# Patient Record
Sex: Female | Born: 2000 | Race: Black or African American | Hispanic: No | Marital: Single | Smoking: Never smoker
Health system: Southern US, Community
[De-identification: ages and names within clinical notes are randomized; demographics above are authoritative.]

## PROBLEM LIST (undated history)

## (undated) DIAGNOSIS — E559 Vitamin D deficiency, unspecified: Secondary | ICD-10-CM

## (undated) HISTORY — DX: Vitamin D deficiency, unspecified: E55.9

---
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
## 2021-01-07 DIAGNOSIS — Z419 Encounter for procedure for purposes other than remedying health state, unspecified: Secondary | ICD-10-CM | POA: Diagnosis not present

## 2022-07-12 IMAGING — DX DG CHEST 1V PORT
1 series · 1 of 1 positions shown · non-contrast
Comparison: None.

CLINICAL DATA: Chest pain

EXAM:
PORTABLE CHEST 1 VIEW

[chest]
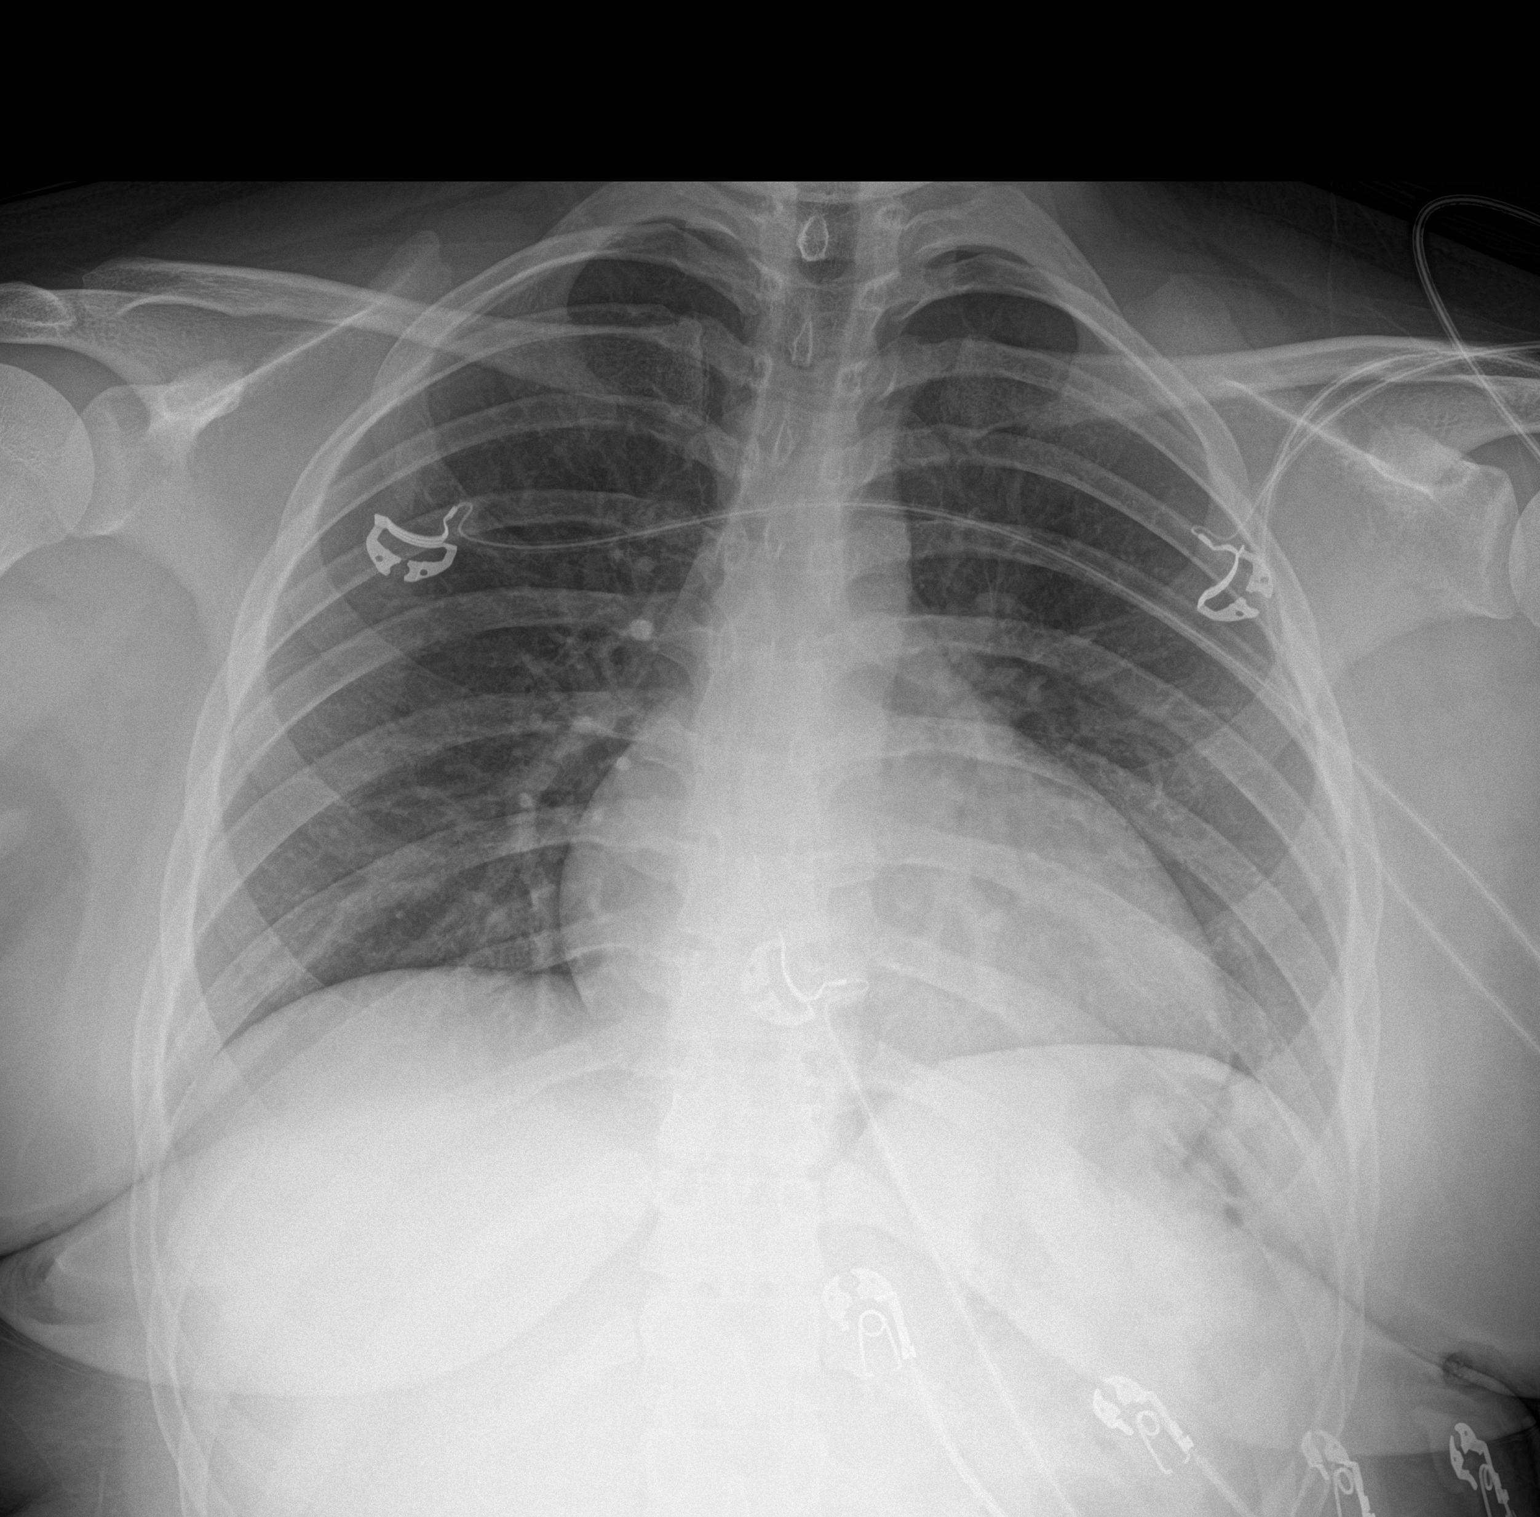

[1 of 1 positions shown; findings below may reference images not displayed]

FINDINGS: The heart size and mediastinal contours are within normal limits.
Both lungs are clear. The visualized skeletal structures are
unremarkable.
IMPRESSION: No active disease.

## 2022-11-10 ENCOUNTER — Inpatient Hospital Stay (HOSPITAL_COMMUNITY)
Admission: AD | Admit: 2022-11-10 | Discharge: 2022-11-10 | Disposition: A | Discharge status: Home / Self Care | Payer: Medicaid Other | Attending: Obstetrics and Gynecology | Admitting: Obstetrics and Gynecology

## 2022-11-10 ENCOUNTER — Encounter (HOSPITAL_COMMUNITY): Payer: Self-pay | Admitting: Obstetrics and Gynecology

## 2022-11-10 ENCOUNTER — Inpatient Hospital Stay (HOSPITAL_COMMUNITY): Payer: Medicaid Other

## 2022-11-10 ENCOUNTER — Ambulatory Visit
Admission: EM | Admit: 2022-11-10 | Discharge: 2022-11-10 | Disposition: A | Discharge status: Home / Self Care | Attending: Internal Medicine | Admitting: Internal Medicine

## 2022-11-10 DIAGNOSIS — Z3A11 11 weeks gestation of pregnancy: Secondary | ICD-10-CM

## 2022-11-10 DIAGNOSIS — O034 Incomplete spontaneous abortion without complication: Secondary | ICD-10-CM | POA: Insufficient documentation

## 2022-11-10 DIAGNOSIS — O26859 Spotting complicating pregnancy, unspecified trimester: Secondary | ICD-10-CM

## 2022-11-10 DIAGNOSIS — O039 Complete or unspecified spontaneous abortion without complication: Secondary | ICD-10-CM

## 2022-11-10 LAB — CBC
HCT: 38.6 % (ref 36.0–46.0)
Hemoglobin: 12.5 g/dL (ref 12.0–15.0)
MCH: 25.3 pg — ABNORMAL LOW (ref 26.0–34.0)
MCHC: 32.4 g/dL (ref 30.0–36.0)
MCV: 78 fL — ABNORMAL LOW (ref 80.0–100.0)
Platelets: 284 10*3/uL (ref 150–400)
RBC: 4.95 MIL/uL (ref 3.87–5.11)
RDW: 13.4 % (ref 11.5–15.5)
WBC: 9.1 10*3/uL (ref 4.0–10.5)
nRBC: 0 % (ref 0.0–0.2)

## 2022-11-10 LAB — URINALYSIS, ROUTINE W REFLEX MICROSCOPIC
Bilirubin Urine: NEGATIVE
Glucose, UA: NEGATIVE mg/dL
Ketones, ur: NEGATIVE mg/dL
Nitrite: NEGATIVE
Protein, ur: NEGATIVE mg/dL
Specific Gravity, Urine: 1.017 (ref 1.005–1.030)
pH: 6 (ref 5.0–8.0)

## 2022-11-10 LAB — HIV ANTIBODY (ROUTINE TESTING W REFLEX): HIV Screen 4th Generation wRfx: NONREACTIVE

## 2022-11-10 LAB — HCG, QUANTITATIVE, PREGNANCY: hCG, Beta Chain, Quant, S: 15404 m[IU]/mL — ABNORMAL HIGH (ref <5)

## 2022-11-10 LAB — POCT URINE PREGNANCY: Preg Test, Ur: POSITIVE — AB

## 2022-11-10 MED ORDER — KETOROLAC TROMETHAMINE 60 MG/2ML IM SOLN
60.0000 mg | Freq: Once | INTRAMUSCULAR | Status: AC
  Administered 2022-11-10: 60 mg via INTRAMUSCULAR
  Filled 2022-11-10: qty 2

## 2022-11-10 MED ORDER — IBUPROFEN 800 MG PO TABS: 800.0000 mg | ORAL_TABLET | Freq: Three times a day (TID) | ORAL | 0 refills | Status: AC | PRN

## 2022-11-10 MED ORDER — MISOPROSTOL 200 MCG PO TABS: 600.0000 ug | ORAL_TABLET | Freq: Once | ORAL | Status: AC

## 2022-11-10 MED ORDER — MISOPROSTOL 200 MCG PO TABS
ORAL_TABLET | ORAL | Status: AC
  Administered 2022-11-10: 600 ug via BUCCAL
  Filled 2022-11-10: qty 3

## 2022-11-10 NOTE — Discharge Instructions (Addendum)
Go to the emergency room for further evaluation of your symptoms

## 2022-11-10 NOTE — ED Notes (Signed)
Patient is being discharged from the Urgent Care and sent to the Emergency Department via POV . Per Cheri Rous NP, patient is in need of higher level of care due to positive pregnancy, abd cramping and bleeding. Patient is aware and verbalizes understanding of plan of care.  Vitals:   11/10/22 0903  BP: 119/83  Pulse: 76  Resp: 17  Temp: 98.1 F (36.7 C)  SpO2: 98%

## 2022-11-10 NOTE — MAU Note (Addendum)
.  Betty Martin is a 22 y.o. at Unknown here in MAU reporting: Intermittent pelvic pain that began last night as well as two episodes of vaginal spotting last night and again at 0400. Reports brown discharge. Denies vaginal itching and vaginal odors. Denies urinary sx's. Last IC yesterday morning.  Has not been seen with an OB yet.  LMP: 08/23/2022 Onset of complaint: Last night Pain score: Denies current pain. Reports when it occurs it is an 8/10.  FHT: unable to doppler FHT Lab orders placed from triage: UA

## 2022-11-10 NOTE — MAU Provider Note (Signed)
History     CSN: [id number]  Arrival date and time: 11/10/22 7829   Event Date/Time   First Provider Initiated Contact with Patient 11/10/22 1157      Chief Complaint  Patient presents with   Pelvic Pain   HPI  [name] is a 22 y.o. female G1P0 @ [redacted]w[redacted]d  (certain LMP) here with vaginal bleeding and abdominal pain. The Abdominal pain and vaginal bleeding started yesterday. She reports more intense pain today. The pain comes and goes. The pain is stronger than a menstrual cramp. Currently rates her pain 8/10  While in MAU the patient called out and reports Passing her fetus.   OB History     Gravida  1   Para      Term      Preterm      AB      Living         SAB      IAB      Ectopic      Multiple      Live Births              No past medical history on file.  No past surgical history on file.  No family history on file.  Social History   Tobacco Use   Smoking status: Never   Smokeless tobacco: Never  Substance Use Topics   Alcohol use: Never   Drug use: Never    Allergies: No Known Allergies  Medications Prior to Admission  Medication Sig Dispense Refill Last Dose   ferrous sulfate 324 MG TBEC Take 324 mg by mouth.   11/09/2022   Prenatal Vit-Fe Fumarate-FA (PRENATAL MULTIVITAMIN) TABS tablet Take 1 tablet by mouth daily at 12 noon.   11/09/2022   Results for orders placed or performed during the hospital encounter of 11/10/22 (from the past 48 hour(s))  Urinalysis, Routine w reflex microscopic -Urine, Clean Catch     Status: Abnormal   Collection Time: 11/10/22  9:47 AM  Result Value Ref Range   Color, Urine YELLOW YELLOW   APPearance HAZY (A) CLEAR   Specific Gravity, Urine 1.017 1.005 - 1.030   pH 6.0 5.0 - 8.0   Glucose, UA NEGATIVE NEGATIVE mg/dL   Hgb urine dipstick MODERATE (A) NEGATIVE   Bilirubin Urine NEGATIVE NEGATIVE   Ketones, ur NEGATIVE NEGATIVE mg/dL   Protein, ur NEGATIVE NEGATIVE mg/dL   Nitrite NEGATIVE  NEGATIVE   Leukocytes,Ua MODERATE (A) NEGATIVE   RBC / HPF 21-50 0 - 5 RBC/hpf   WBC, UA 11-20 0 - 5 WBC/hpf   Bacteria, UA RARE (A) NONE SEEN   Squamous Epithelial / HPF 0-5 0 - 5 /HPF   Mucus PRESENT     Comment: Performed at [facility], [address] [place], [place] [address]  CBC     Status: Abnormal   Collection Time: 11/10/22 11:08 AM  Result Value Ref Range   WBC 9.1 4.0 - 10.5 K/uL   RBC 4.95 3.87 - 5.11 MIL/uL   Hemoglobin 12.5 12.0 - 15.0 g/dL   HCT 08.6 57.8 - 46.9 %   MCV 78.0 (L) 80.0 - 100.0 fL   MCH 25.3 (L) 26.0 - 34.0 pg   MCHC 32.4 30.0 - 36.0 g/dL   RDW 62.9 52.8 - 41.3 %   Platelets 284 150 - 400 K/uL   nRBC 0.0 0.0 - 0.2 %    Comment: Performed at [facility], [address],  [place], [place] [address]  ABO/Rh     Status: None   Collection Time: 11/10/22 11:08 AM  Result Value Ref Range   ABO/RH(D)      O POS Performed at [facility], [address] [place], [place] [address]   hCG, quantitative, pregnancy     Status: Abnormal   Collection Time: 11/10/22 11:08 AM  Result Value Ref Range   hCG, Beta Chain, Quant, S 15,404 (H) <5 mIU/mL    Comment:          GEST. AGE      CONC.  (mIU/mL)   <=1 WEEK        5 - 50     2 WEEKS       50 - 500     3 WEEKS       100 - 10,000     4 WEEKS     1,000 - 30,000     5 WEEKS     3,500 - 115,000   6-8 WEEKS     12,000 - 270,000    12 WEEKS     15,000 - 220,000        FEMALE AND NON-PREGNANT FEMALE:     LESS THAN 5 mIU/mL Performed at [facility], [address], [place], [place] [address]   HIV Antibody (routine testing w rflx)     Status: None   Collection Time: 11/10/22 11:08 AM  Result Value Ref Range   HIV Screen 4th Generation wRfx Non Reactive Non Reactive    Comment: Performed at [facility], [address], [place], [place] [address]    US OB LESS THAN 14 WEEKS WITH OB TRANSVAGINAL  Result Date: 11/10/2022 CLINICAL DATA:  Spotting and cramping affecting pregnancy,  antepartum. Quantitative beta hCG pending. Last menstrual period 08/23/2022 corresponding to estimated gestational age of [redacted] weeks 2 days. EXAM: OBSTETRIC <14 WK [place] AND TRANSVAGINAL OB US TECHNIQUE: Both transabdominal and transvaginal ultrasound examinations were performed for complete evaluation of the gestation as well as the maternal uterus, adnexal regions, and pelvic cul-de-sac. Transvaginal technique was performed to assess early pregnancy. The sonographer reports the patient did not tolerate the transvaginal exam well. COMPARISON:  None Available. FINDINGS: Intrauterine gestational sac: None Yolk sac:  Not Visualized. Embryo:  Not Visualized. Cardiac Activity: Not applicable Subchorionic hemorrhage:  None visualized. Maternal uterus/adnexae: The endometrium is thickened and heterogeneous, measuring up to approximately 26 mm in AP dimension. No gestational sac is visualized. IMPRESSION: 1. No gestational sac or fetal pole is seen. No intrauterine pregnancy is identified. 2. Differential considerations include early pregnancy, failed pregnancy, or ectopic pregnancy. Recommend correlation with the pending quantitative beta HCG. 3. Recommend serial follow-up clinical exams and quantitative beta HCG evaluation. Based on this information, follow-up ultrasound also can be performed. Electronically Signed   By: Neita Garnet M.D.   On: 11/10/2022 14:51     Review of Systems  Constitutional:  Negative for fever.  Gastrointestinal:  Positive for abdominal pain and nausea. Negative for vomiting.  Genitourinary:  Positive for vaginal bleeding.   Physical Exam   Blood pressure 117/81, pulse 82, temperature 97.9 F (36.6 C), temperature source Oral, resp. rate 16, height 5\' 1"  (1.549 m), weight 87.8 kg, last menstrual period 08/23/2022, SpO2 97%.  Physical Exam Constitutional:      General: She is not in acute distress.    Appearance: Normal appearance. She is not ill-appearing, toxic-appearing or  diaphoretic.  Genitourinary:  Comments: Vagina - large amount of bleeding with multiple small- moderate sized clots  Cervix - 1-2 CM dilated with placenta tissue coming out of cervix.  Majority of placenta passed. Small placenta tissue within cervical os, unable to remove with ring forceps.  Bleeding subsided following placenta passing.  Bimanual exam: deferred. Chaperone present for exam.   Skin:    General: Skin is warm.  Neurological:     Mental Status: She is alert and oriented to person, place, and time.  Psychiatric:        Behavior: Behavior normal.    MAU Course  Procedures  MDM  10/11 week fetus noted on tissue paper. Fetus with attached placental cord.   Toradol 60 mg IM CBC, HCG, ABO Hgb stable  [place] ordered and reassuring findings other than ?POC although expected given recent SAB Cytotec buccal given Oral 600 mcg Placental tissue sent to pathology Patient requested to take fetus home.  Patient rates her pain 0/10 at the time of DC home.  Bleeding minimal to absent at the time of DC.  O positive blood type.   Assessment and Plan   A:  1. SAB (spontaneous abortion)   2. [redacted] weeks gestation of pregnancy   3. Incomplete abortion      P:  Dc home Strict return precautions ie: Fever, abdominal pain, heavy bleeding Rx: ibuprofen Q6 hours. Message sent to [facility] for follow up in 1 week.  Support given  Venia Carbon I, NP 11/10/2022 9:03 PM

## 2022-11-10 NOTE — ED Provider Notes (Signed)
[facility]    CSN: [id number] Arrival date & time: 11/10/22  0825      History   Chief Complaint No chief complaint on file.   HPI Betty Martin is a 22 y.o. female presents for abdominal cramping/pain and spotting in pregnancy.  Patient reports she is 11 weeks and 2 days pregnant.  Last night she began developing abdominal cramping that she describes as severe with vaginal spotting.  Denies any urinary symptoms, dizziness, nausea or vomiting.  This is her first pregnancy.  She has not seen OB.  No other concerns at this time.  HPI  No past medical history on file.  There are no problems to display for this patient.   No past surgical history on file.  OB History   No obstetric history on file.      Home Medications    Prior to Admission medications   Not on File    Family History No family history on file.  Social History Social History   Tobacco Use   Smoking status: Never   Smokeless tobacco: Never  Substance Use Topics   Alcohol use: Never   Drug use: Never     Allergies   Patient has no known allergies.   Review of Systems Review of Systems  Gastrointestinal:  Positive for abdominal pain.  Genitourinary:  Positive for vaginal bleeding.     Physical Exam Triage Vital Signs ED Triage Vitals [11/10/22 0903]  Encounter Vitals Group     BP 119/83     Systolic BP Percentile      Diastolic BP Percentile      Pulse Rate 76     Resp 17     Temp 98.1 F (36.7 C)     Temp Source Oral     SpO2 98 %     Weight      Height      Head Circumference      Peak Flow      Pain Score      Pain Loc      Pain Education      Exclude from Growth Chart    No data found.  Updated Vital Signs BP 119/83 (BP Location: Left Arm)   Pulse 76   Temp 98.1 F (36.7 C) (Oral)   Resp 17   SpO2 98%   Visual Acuity Right Eye Distance:   Left Eye Distance:   Bilateral Distance:    Right Eye Near:   Left Eye Near:    Bilateral Near:      Physical Exam Vitals and nursing note reviewed.  Constitutional:      General: She is not in acute distress.    Appearance: Normal appearance. She is not ill-appearing.  HENT:     Head: Normocephalic and atraumatic.  Eyes:     Pupils: Pupils are equal, round, and reactive to light.  Cardiovascular:     Rate and Rhythm: Normal rate.  Pulmonary:     Effort: Pulmonary effort is normal.  Skin:    General: Skin is warm and dry.  Neurological:     General: No focal deficit present.     Mental Status: She is alert and oriented to person, place, and time.  Psychiatric:        Mood and Affect: Mood normal.        Behavior: Behavior normal.      UC Treatments / Results  Labs (all labs ordered are listed, but only abnormal results  are displayed) Labs Reviewed  POCT URINE PREGNANCY - Abnormal; Notable for the following components:      Result Value   Preg Test, Ur Positive (*)    All other components within normal limits    EKG   Radiology No results found.  Procedures Procedures (including critical care time)  Medications Ordered in UC Medications - No data to display  Initial Impression / Assessment and Plan / UC Course  I have reviewed the triage vital signs and the nursing notes.  Pertinent labs & imaging results that were available during my care of the patient were reviewed by me and considered in my medical decision making (see chart for details).     Patient with confirmed urine pregnancy in clinic reporting that she is 11 weeks and 2 days pregnant with severe abdominal cramping and vaginal spotting.  Discussed limitations and abilities of urgent care.  Advised patient go to [facility] emergency room/MAU for further evaluation of symptoms/rule out miscarriage.  Patient is in agreement with plan will go POV with her mother to the emergency room. Final Clinical Impressions(s) / UC Diagnoses   Final diagnoses:  [redacted] weeks gestation of pregnancy  Spotting in  pregnancy     Discharge Instructions      Go to the emergency room for further evaluation of your symptoms    ED Prescriptions   None    PDMP not reviewed this encounter.   Radford Pax, NP 11/10/22 [phone]

## 2022-11-11 LAB — ABO/RH: ABO/RH(D): O POS

## 2022-11-12 LAB — SURGICAL PATHOLOGY

## 2022-12-08 DIAGNOSIS — Z419 Encounter for procedure for purposes other than remedying health state, unspecified: Secondary | ICD-10-CM | POA: Diagnosis not present

## 2023-01-08 DIAGNOSIS — Z419 Encounter for procedure for purposes other than remedying health state, unspecified: Secondary | ICD-10-CM | POA: Diagnosis not present

## 2023-02-07 DIAGNOSIS — Z419 Encounter for procedure for purposes other than remedying health state, unspecified: Secondary | ICD-10-CM | POA: Diagnosis not present

## 2023-03-10 DIAGNOSIS — Z419 Encounter for procedure for purposes other than remedying health state, unspecified: Secondary | ICD-10-CM | POA: Diagnosis not present

## 2023-04-10 DIAGNOSIS — Z419 Encounter for procedure for purposes other than remedying health state, unspecified: Secondary | ICD-10-CM | POA: Diagnosis not present

## 2023-05-08 DIAGNOSIS — Z419 Encounter for procedure for purposes other than remedying health state, unspecified: Secondary | ICD-10-CM | POA: Diagnosis not present

## 2023-06-19 DIAGNOSIS — Z419 Encounter for procedure for purposes other than remedying health state, unspecified: Secondary | ICD-10-CM | POA: Diagnosis not present

## 2023-07-19 DIAGNOSIS — Z419 Encounter for procedure for purposes other than remedying health state, unspecified: Secondary | ICD-10-CM | POA: Diagnosis not present

## 2023-07-26 DIAGNOSIS — E88819 Insulin resistance, unspecified: Secondary | ICD-10-CM | POA: Diagnosis not present

## 2023-07-26 DIAGNOSIS — Z7251 High risk heterosexual behavior: Secondary | ICD-10-CM | POA: Diagnosis not present

## 2023-07-26 DIAGNOSIS — Z1331 Encounter for screening for depression: Secondary | ICD-10-CM | POA: Diagnosis not present

## 2023-07-26 DIAGNOSIS — E559 Vitamin D deficiency, unspecified: Secondary | ICD-10-CM | POA: Diagnosis not present

## 2023-07-26 DIAGNOSIS — Z1339 Encounter for screening examination for other mental health and behavioral disorders: Secondary | ICD-10-CM | POA: Diagnosis not present

## 2023-07-26 DIAGNOSIS — Z1159 Encounter for screening for other viral diseases: Secondary | ICD-10-CM | POA: Diagnosis not present

## 2023-07-26 DIAGNOSIS — R0602 Shortness of breath: Secondary | ICD-10-CM | POA: Diagnosis not present

## 2023-07-26 DIAGNOSIS — R5383 Other fatigue: Secondary | ICD-10-CM | POA: Diagnosis not present

## 2023-07-26 DIAGNOSIS — Z0000 Encounter for general adult medical examination without abnormal findings: Secondary | ICD-10-CM | POA: Diagnosis not present

## 2023-07-26 DIAGNOSIS — Z6838 Body mass index (BMI) 38.0-38.9, adult: Secondary | ICD-10-CM | POA: Diagnosis not present

## 2023-08-19 DIAGNOSIS — Z419 Encounter for procedure for purposes other than remedying health state, unspecified: Secondary | ICD-10-CM | POA: Diagnosis not present

## 2023-09-18 DIAGNOSIS — Z419 Encounter for procedure for purposes other than remedying health state, unspecified: Secondary | ICD-10-CM | POA: Diagnosis not present

## 2023-10-19 DIAGNOSIS — Z419 Encounter for procedure for purposes other than remedying health state, unspecified: Secondary | ICD-10-CM | POA: Diagnosis not present

## 2023-11-29 DIAGNOSIS — Z79899 Other long term (current) drug therapy: Secondary | ICD-10-CM | POA: Diagnosis not present

## 2023-11-29 DIAGNOSIS — Z7251 High risk heterosexual behavior: Secondary | ICD-10-CM | POA: Diagnosis not present

## 2023-11-29 DIAGNOSIS — E88819 Insulin resistance, unspecified: Secondary | ICD-10-CM | POA: Diagnosis not present

## 2023-11-29 DIAGNOSIS — R5383 Other fatigue: Secondary | ICD-10-CM | POA: Diagnosis not present

## 2023-11-29 DIAGNOSIS — Z6838 Body mass index (BMI) 38.0-38.9, adult: Secondary | ICD-10-CM | POA: Diagnosis not present

## 2023-11-29 DIAGNOSIS — E559 Vitamin D deficiency, unspecified: Secondary | ICD-10-CM | POA: Diagnosis not present

## 2023-11-29 DIAGNOSIS — Z5320 Procedure and treatment not carried out because of patient's decision for unspecified reasons: Secondary | ICD-10-CM | POA: Diagnosis not present

## 2023-11-29 DIAGNOSIS — E786 Lipoprotein deficiency: Secondary | ICD-10-CM | POA: Diagnosis not present

## 2024-03-06 LAB — PREGNANCY, URINE: Pregnancy Test, Urine: POSITIVE

## 2024-03-21 ENCOUNTER — Encounter: Payer: Self-pay | Admitting: Obstetrics and Gynecology

## 2024-03-21 DIAGNOSIS — O3680X0 Pregnancy with inconclusive fetal viability, not applicable or unspecified: Secondary | ICD-10-CM

## 2024-03-27 ENCOUNTER — Other Ambulatory Visit: Payer: Self-pay

## 2024-03-27 DIAGNOSIS — O3680X0 Pregnancy with inconclusive fetal viability, not applicable or unspecified: Secondary | ICD-10-CM

## 2024-03-28 ENCOUNTER — Other Ambulatory Visit: Payer: Self-pay

## 2024-03-28 ENCOUNTER — Other Ambulatory Visit

## 2024-03-28 ENCOUNTER — Other Ambulatory Visit: Payer: Self-pay | Admitting: Certified Nurse Midwife

## 2024-03-28 DIAGNOSIS — Z3491 Encounter for supervision of normal pregnancy, unspecified, first trimester: Secondary | ICD-10-CM | POA: Diagnosis not present

## 2024-03-28 DIAGNOSIS — O3680X0 Pregnancy with inconclusive fetal viability, not applicable or unspecified: Secondary | ICD-10-CM

## 2024-03-28 DIAGNOSIS — Z3A01 Less than 8 weeks gestation of pregnancy: Secondary | ICD-10-CM | POA: Diagnosis not present

## 2024-04-18 ENCOUNTER — Telehealth: Payer: Self-pay

## 2024-04-27 ENCOUNTER — Encounter: Payer: Self-pay | Admitting: Obstetrics and Gynecology
